# Patient Record
Sex: Female | Born: 1950 | Hispanic: No | Smoking: Never smoker
Health system: Southern US, Community
[De-identification: ages and names within clinical notes are randomized; demographics above are authoritative.]

## PROBLEM LIST (undated history)

## (undated) DIAGNOSIS — I10 Essential (primary) hypertension: Secondary | ICD-10-CM

---

## 2005-07-03 ENCOUNTER — Ambulatory Visit: Payer: Self-pay

## 2006-09-09 ENCOUNTER — Ambulatory Visit: Payer: Self-pay

## 2007-12-15 ENCOUNTER — Ambulatory Visit: Payer: Self-pay

## 2008-10-19 ENCOUNTER — Ambulatory Visit: Payer: Self-pay | Admitting: Gastroenterology

## 2008-12-28 ENCOUNTER — Ambulatory Visit: Payer: Self-pay | Admitting: Internal Medicine

## 2009-07-03 ENCOUNTER — Ambulatory Visit: Payer: Self-pay | Admitting: Oncology

## 2009-07-20 ENCOUNTER — Other Ambulatory Visit: Admission: RE | Admit: 2009-07-20 | Discharge: 2009-07-20 | Payer: Self-pay | Admitting: Oncology

## 2009-07-20 LAB — CBC WITH DIFFERENTIAL (CANCER CENTER ONLY)
BASO#: 0 10*3/uL (ref 0.0–0.2)
EOS%: 12.3 % — ABNORMAL HIGH (ref 0.0–7.0)
Eosinophils Absolute: 0.6 10*3/uL — ABNORMAL HIGH (ref 0.0–0.5)
HGB: 12.7 g/dL (ref 11.6–15.9)
LYMPH%: 53.6 % — ABNORMAL HIGH (ref 14.0–48.0)
MCH: 28.5 pg (ref 26.0–34.0)
MCHC: 33.3 g/dL (ref 32.0–36.0)
MCV: 86 fL (ref 81–101)
MONO%: 5 % (ref 0.0–13.0)
NEUT#: 1.4 10*3/uL — ABNORMAL LOW (ref 1.5–6.5)
RBC: 4.47 10*6/uL (ref 3.70–5.32)

## 2009-07-20 LAB — CMP (CANCER CENTER ONLY)
Albumin: 3.9 g/dL (ref 3.3–5.5)
Alkaline Phosphatase: 53 U/L (ref 26–84)
BUN, Bld: 17 mg/dL (ref 7–22)
CO2: 32 mEq/L (ref 18–33)
Calcium: 9.6 mg/dL (ref 8.0–10.3)
Chloride: 96 mEq/L — ABNORMAL LOW (ref 98–108)
Glucose, Bld: 87 mg/dL (ref 73–118)
Potassium: 3.8 mEq/L (ref 3.3–4.7)
Sodium: 133 mEq/L (ref 128–145)
Total Protein: 7.1 g/dL (ref 6.4–8.1)

## 2009-07-21 LAB — IRON AND TIBC
Iron: 69 ug/dL (ref 42–145)
UIBC: 213 ug/dL

## 2009-07-21 LAB — LACTATE DEHYDROGENASE: LDH: 180 U/L (ref 94–250)

## 2009-07-21 LAB — FERRITIN: Ferritin: 290 ng/mL (ref 10–291)

## 2009-08-09 ENCOUNTER — Ambulatory Visit: Payer: Self-pay | Admitting: Oncology

## 2009-09-06 ENCOUNTER — Ambulatory Visit: Payer: Self-pay | Admitting: Oncology

## 2009-09-12 LAB — CBC WITH DIFFERENTIAL (CANCER CENTER ONLY)
BASO#: 0 10*3/uL (ref 0.0–0.2)
EOS%: 9.7 % — ABNORMAL HIGH (ref 0.0–7.0)
Eosinophils Absolute: 0.5 10*3/uL (ref 0.0–0.5)
HCT: 38.9 % (ref 34.8–46.6)
HGB: 13 g/dL (ref 11.6–15.9)
LYMPH#: 2.3 10*3/uL (ref 0.9–3.3)
MCH: 29 pg (ref 26.0–34.0)
MCHC: 33.4 g/dL (ref 32.0–36.0)
MONO%: 5.6 % (ref 0.0–13.0)
NEUT#: 1.7 10*3/uL (ref 1.5–6.5)
NEUT%: 35.2 % — ABNORMAL LOW (ref 39.6–80.0)
RBC: 4.47 10*6/uL (ref 3.70–5.32)

## 2010-01-01 ENCOUNTER — Ambulatory Visit: Payer: Self-pay | Admitting: Unknown Physician Specialty

## 2011-01-08 ENCOUNTER — Ambulatory Visit: Payer: Self-pay | Admitting: Unknown Physician Specialty

## 2012-01-09 ENCOUNTER — Ambulatory Visit: Payer: Self-pay | Admitting: Internal Medicine

## 2013-08-18 ENCOUNTER — Ambulatory Visit: Payer: Self-pay

## 2014-10-17 IMAGING — MG MM CAD SCREENING MAMMO
1 series · 5 of 5 positions shown · non-contrast
Comparison: none

REASON FOR EXAM: SCR MAMMO NO ORDER
COMMENTS:

[R CC · right · 5 of 5 slices shown]
[im 1/5]
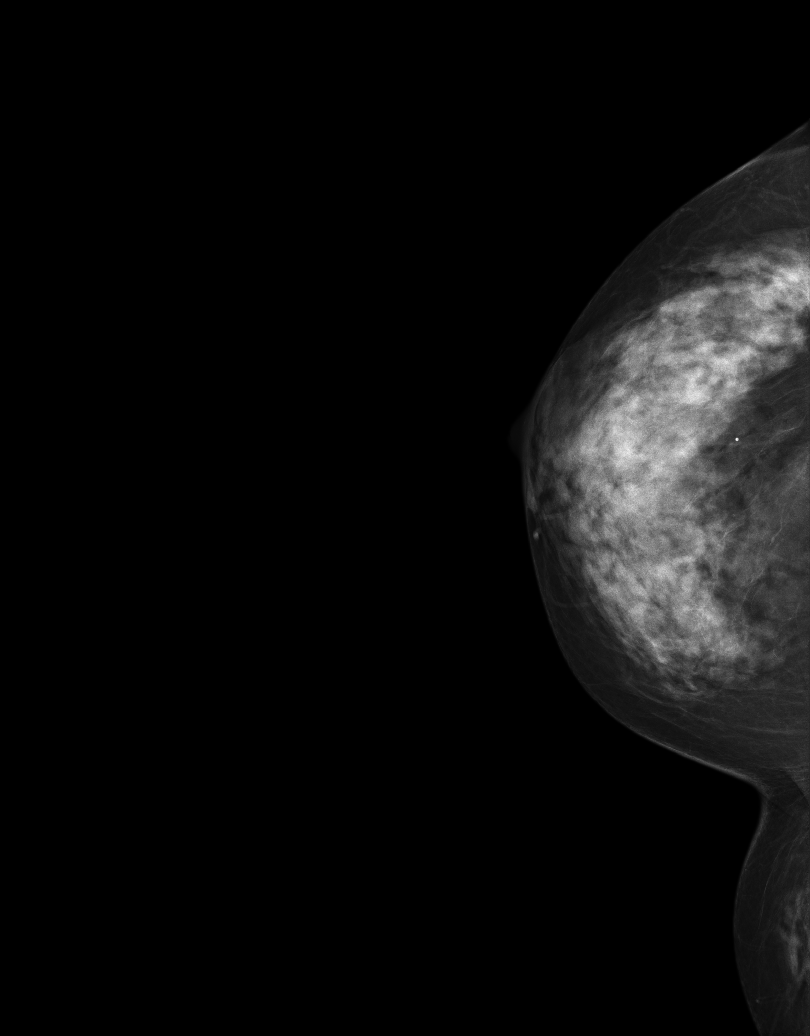
[im 2/5]
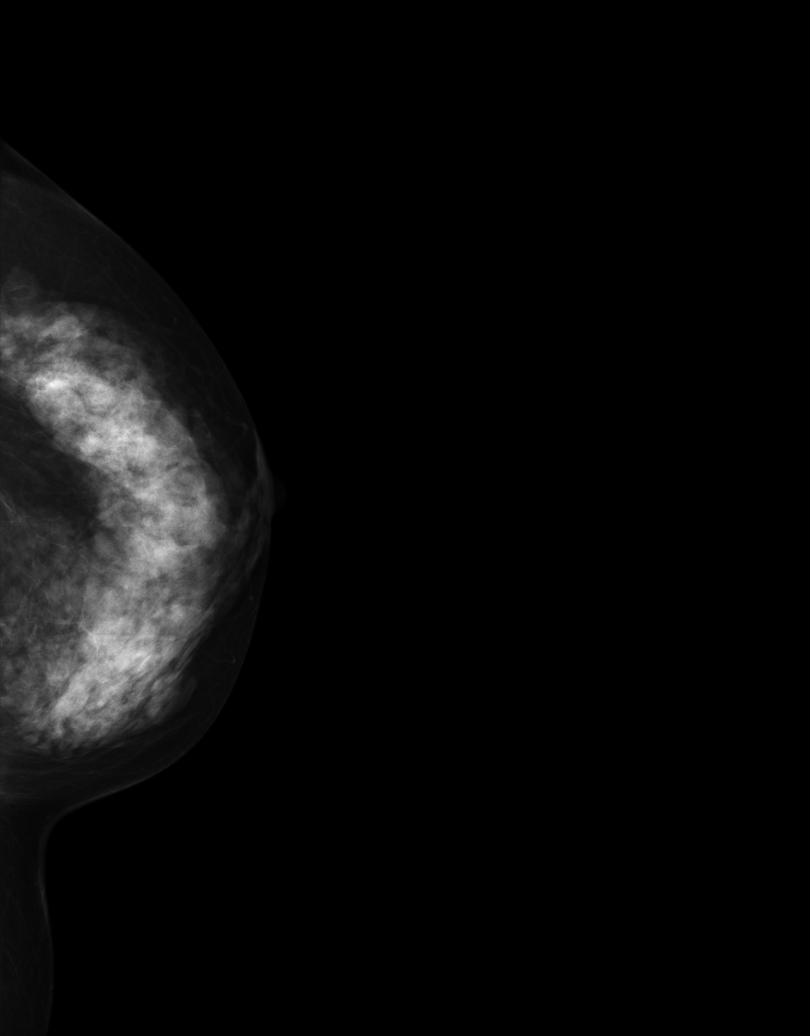
[im 3/5]
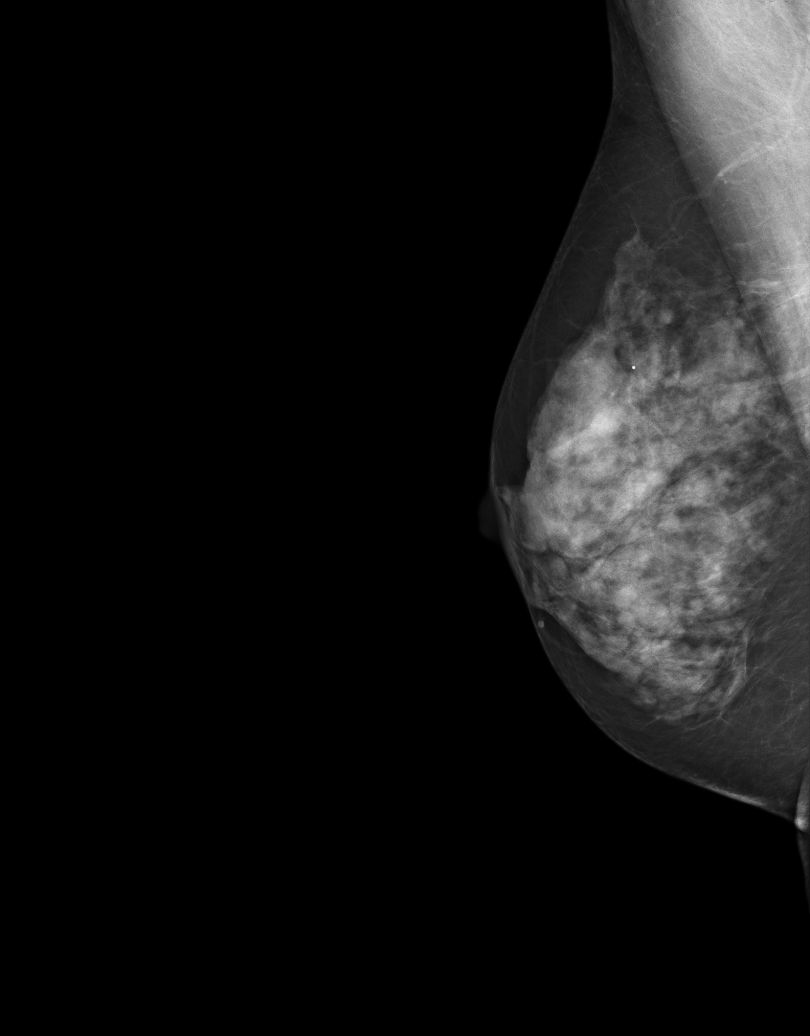
[im 4/5]
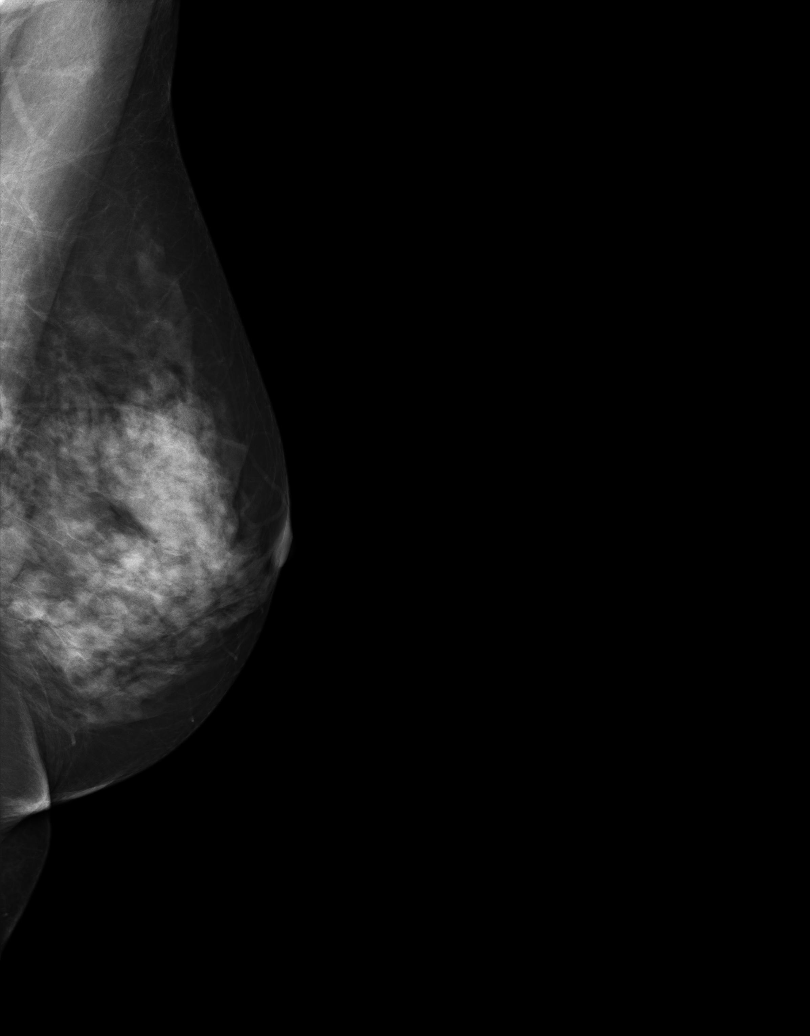
[im 5/5]
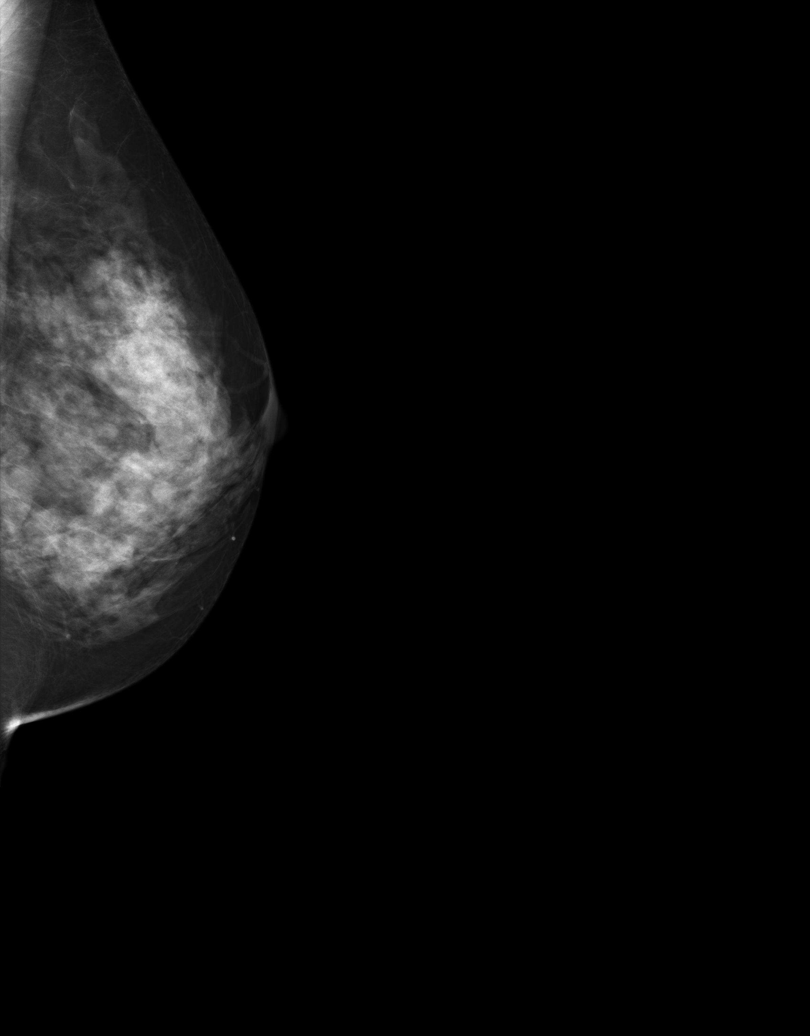

[5 of 5 positions shown; findings below may reference images not displayed]

PROCEDURE:     MAM - MAM DGTL SCRN MAM NO ORDER W/CAD  - January 09, 2012  [DATE]

RESULT:     There is no known family history or personal history of breast
cancer. The patient denies previous breast surgery. Comparison is made to
previous digital mammographic images from 08 January, 2011, as well as 01 January, 2010 and 15 December, 2007. The breasts exhibit a dense nodular,
fibroglandular pattern with stable, benign calcification. There is no
architectural distortion, developing parenchymal density or dominant mass.
The mammographic appearance is stable.
IMPRESSION: 1. Stable, benign appearing bilateral mammogram.

BI-RADS: Category 2 - Benign Finding.

Please continue to encourage annual mammographic follow-up.

A NEGATIVE MAMMOGRAM REPORT DOES NOT PRECLUDE BIOPSY OR OTHER EVALUATION OF
A CLINICALLY PALPABLE OR OTHERWISE SUSPICIOUS MASS OR LESION. BREAST CANCER
MAY NOT BE DETECTED BY MAMMOGRAPHY IN UP TO 10% OF CASES.

[REDACTED]

## 2017-06-14 ENCOUNTER — Other Ambulatory Visit: Payer: Self-pay

## 2017-06-14 ENCOUNTER — Ambulatory Visit
Admission: EM | Admit: 2017-06-14 | Discharge: 2017-06-14 | Disposition: A | Discharge status: Home / Self Care | Payer: Managed Care, Other (non HMO) | Attending: Family Medicine | Admitting: Family Medicine

## 2017-06-14 ENCOUNTER — Ambulatory Visit: Payer: Managed Care, Other (non HMO)

## 2017-06-14 DIAGNOSIS — J069 Acute upper respiratory infection, unspecified: Secondary | ICD-10-CM | POA: Diagnosis not present

## 2017-06-14 HISTORY — DX: Essential (primary) hypertension: I10

## 2017-06-14 MED ORDER — BENZONATATE 100 MG PO CAPS: 100.0000 mg | ORAL_CAPSULE | Freq: Three times a day (TID) | ORAL | 0 refills | Status: DC

## 2017-06-14 MED ORDER — AZITHROMYCIN 250 MG PO TABS: ORAL_TABLET | ORAL | 0 refills | Status: DC

## 2017-06-14 MED ORDER — PREDNISONE 10 MG PO TABS: 20.0000 mg | ORAL_TABLET | Freq: Every day | ORAL | 0 refills | Status: DC

## 2017-06-14 MED ORDER — HYDROCODONE-HOMATROPINE 5-1.5 MG/5ML PO SYRP: 5.0000 mL | ORAL_SOLUTION | Freq: Every evening | ORAL | 0 refills | Status: DC | PRN

## 2017-06-14 NOTE — Discharge Instructions (Signed)
-  Take medications as prescribed. -If cough worsens contact PCP or [facility]. -Mucinex OTC for chest congestion.

## 2017-06-14 NOTE — ED Triage Notes (Signed)
Pt with 3 days of nasal and head congestion, chest congestion, and cough. Now with some laryngitis.

## 2017-06-14 NOTE — ED Provider Notes (Signed)
[facility]    CSN: [id number] Arrival date & time: 06/14/17  9509     History   Chief Complaint Chief Complaint  Patient presents with  . Nasal Congestion    HPI Charlotte Jennings is a 67 y.o. female who presents today for evaluation of a 3-day history of headache, productive cough, chest and nasal congestion and mild fever.  The patient reports that cough is consistent throughout the day and worse at night, she does produce yellow mucus material.  She denies significant facial pressure, she does feel that she is experiencing postnasal drip.  She denies any flu exposure.  She denies any recent sick contacts.  She does not smoke.  No history of asthma or COPD.  Originally she did not have any sore throat however due to her recent cough she has increased throat irritation and has developed hoarseness.  Denies any shortness of breath.  HPI  Past Medical History:  Diagnosis Date  . Hypertension     There are no active problems to display for this patient.   History reviewed. No pertinent surgical history.  OB History   None      Home Medications    Prior to Admission medications   Medication Sig Start Date End Date Taking? Authorizing Provider  lisinopril-hydrochlorothiazide (PRINZIDE,ZESTORETIC) 20-25 MG tablet Take 1 tablet by mouth daily.   Yes [provider]  azithromycin (ZITHROMAX Z-PAK) 250 MG tablet Take 2 tabs on day 1 and 1 tablet each day after 06/14/17   Lattie Corns, PA-C  benzonatate (TESSALON) 100 MG capsule Take 1 capsule (100 mg total) by mouth every 8 (eight) hours. 06/14/17   Lattie Corns, PA-C  HYDROcodone-homatropine [facility]) 5-1.5 MG/5ML syrup Take 5 mLs by mouth at bedtime as needed for cough. 06/14/17   Lattie Corns, PA-C  predniSONE (DELTASONE) 10 MG tablet Take 2 tablets (20 mg total) by mouth daily. 06/14/17   Lattie Corns, PA-C    Family History History reviewed. No pertinent family  history.  Social History Social History   Tobacco Use  . Smoking status: Never Smoker  . Smokeless tobacco: Never Used  Substance Use Topics  . Alcohol use: Not Currently  . Drug use: Not Currently     Allergies   Patient has no known allergies.   Review of Systems Review of Systems  Constitutional: Negative.   HENT: Positive for sore throat. Negative for trouble swallowing.   Eyes: Negative.   Respiratory: Positive for cough.   Cardiovascular: Negative.   Gastrointestinal: Negative.   Endocrine: Negative.   Genitourinary: Negative.   Musculoskeletal: Negative.   Skin: Negative.   Allergic/Immunologic: Negative.   Neurological: Negative.   Hematological: Negative.   Psychiatric/Behavioral: Negative.    Physical Exam Triage Vital Signs ED Triage Vitals  Enc Vitals Group     BP 06/14/17 1024 (!) 145/98     Pulse Rate 06/14/17 1024 71     Resp 06/14/17 1024 16     Temp 06/14/17 1024 98.2 F (36.8 C)     Temp Source 06/14/17 1024 Oral     SpO2 06/14/17 1024 99 %     Weight 06/14/17 1025 150 lb (68 kg)     Height 06/14/17 1025 5\' 5"  (1.651 m)     Head Circumference --      Peak Flow --      Pain Score 06/14/17 1025 0     Pain Loc --      Pain  Edu? --      Excl. in [place]? --    No data found.  Updated Vital Signs BP (!) 145/98 (BP Location: Left Arm)   Pulse 71   Temp 98.2 F (36.8 C) (Oral)   Resp 16   Ht 5\' 5"  (1.651 m)   Wt 150 lb (68 kg)   SpO2 99%   BMI 24.96 kg/m   Visual Acuity Right Eye Distance:   Left Eye Distance:   Bilateral Distance:    Right Eye Near:   Left Eye Near:    Bilateral Near:     Physical Exam  Constitutional: She appears well-developed and well-nourished. No distress.  HENT:  Bilateral tympanic membrane without evidence for infection or rupture.  Mild nasal erythema.  Mild oropharynx erythema.  No purulent drainage.  Nontender to palpation over the maxillary or frontal sinuses.  Cardiovascular: Normal rate, regular  rhythm and normal heart sounds.  Pulmonary/Chest: Effort normal.  Mild wheeze to bilateral upper lobes.  Abdominal: Soft.  Lymphadenopathy:    She has no cervical adenopathy.  Skin: She is not diaphoretic.   UC Treatments / Results  Labs (all labs ordered are listed, but only abnormal results are displayed) Labs Reviewed - No data to display  EKG None Radiology Dg Chest 2 View  Result Date: 06/14/2017 CLINICAL DATA:  67 year old with fever and congestion.  Cough. EXAM: CHEST - 2 VIEW COMPARISON:  None. FINDINGS: The heart size and mediastinal contours are within normal limits. Both lungs are clear. The visualized skeletal structures are unremarkable. IMPRESSION: No active cardiopulmonary disease. Electronically Signed   By: Markus Daft M.D.   On: 06/14/2017 12:28    Procedures Procedures (including critical care time)  Medications Ordered in UC Medications - No data to display   Initial Impression / Assessment and Plan / UC Course  I have reviewed the triage vital signs and the nursing notes.  Pertinent labs & imaging results that were available during my care of the patient were reviewed by me and considered in my medical decision making (see chart for details).     1.  Treatment options were discussed today. 2.  No signs of pneumonia on chest x-ray.  3.  The patient was prescribed a Z-Pak, prednisone taper, hycodan cough syrup and tessalon pearls. 4.  If symptoms continue or worsen she will follow-up with PCP or [facility].  Final Clinical Impressions(s) / UC Diagnoses   Final diagnoses:  Upper respiratory tract infection, unspecified type    ED Discharge Orders        Ordered    azithromycin (ZITHROMAX Z-PAK) 250 MG tablet     06/14/17 1242    predniSONE (DELTASONE) 10 MG tablet  Daily     06/14/17 1242    benzonatate (TESSALON) 100 MG capsule  Every 8 hours     06/14/17 1242    HYDROcodone-homatropine (HYCODAN) 5-1.5 MG/5ML syrup  At bedtime PRN      06/14/17 1242     Controlled Substance Prescriptions [place] Controlled Substance Registry consulted? Not Applicable   Lattie Corns, PA-C 06/14/17 1540

## 2018-01-26 ENCOUNTER — Encounter: Payer: Self-pay | Admitting: Family Medicine

## 2018-01-29 ENCOUNTER — Other Ambulatory Visit: Payer: Self-pay

## 2018-01-29 DIAGNOSIS — Z1211 Encounter for screening for malignant neoplasm of colon: Secondary | ICD-10-CM

## 2018-02-12 ENCOUNTER — Ambulatory Visit: Payer: Managed Care, Other (non HMO) | Admitting: Anesthesiology

## 2018-02-12 ENCOUNTER — Encounter
Admission: RE | Disposition: A | Discharge status: Home / Self Care | Payer: Self-pay | Source: Ambulatory Visit | Attending: Gastroenterology

## 2018-02-12 ENCOUNTER — Ambulatory Visit
Admission: RE | Admit: 2018-02-12 | Discharge: 2018-02-12 | Disposition: A | Discharge status: Home / Self Care | Payer: Managed Care, Other (non HMO) | Source: Ambulatory Visit | Attending: Gastroenterology | Admitting: Gastroenterology

## 2018-02-12 ENCOUNTER — Encounter: Payer: Self-pay | Admitting: *Deleted

## 2018-02-12 DIAGNOSIS — I10 Essential (primary) hypertension: Secondary | ICD-10-CM | POA: Insufficient documentation

## 2018-02-12 DIAGNOSIS — Z1211 Encounter for screening for malignant neoplasm of colon: Secondary | ICD-10-CM | POA: Insufficient documentation

## 2018-02-12 DIAGNOSIS — D123 Benign neoplasm of transverse colon: Secondary | ICD-10-CM | POA: Insufficient documentation

## 2018-02-12 DIAGNOSIS — Z79899 Other long term (current) drug therapy: Secondary | ICD-10-CM | POA: Diagnosis not present

## 2018-02-12 DIAGNOSIS — K5730 Diverticulosis of large intestine without perforation or abscess without bleeding: Secondary | ICD-10-CM | POA: Diagnosis not present

## 2018-02-12 HISTORY — PX: COLONOSCOPY WITH PROPOFOL: SHX5780

## 2018-02-12 SURGERY — COLONOSCOPY WITH PROPOFOL
Anesthesia: General

## 2018-02-12 MED ORDER — PROPOFOL 500 MG/50ML IV EMUL
INTRAVENOUS | Status: DC | PRN
  Administered 2018-02-12: 100 ug/kg/min via INTRAVENOUS

## 2018-02-12 MED ORDER — PROPOFOL 10 MG/ML IV BOLUS
INTRAVENOUS | Status: DC | PRN
  Administered 2018-02-12: 60 mg via INTRAVENOUS

## 2018-02-12 MED ORDER — SODIUM CHLORIDE 0.9 % IV SOLN
INTRAVENOUS | Status: DC
  Administered 2018-02-12: 1000 mL via INTRAVENOUS

## 2018-02-12 MED ORDER — PROPOFOL 10 MG/ML IV BOLUS
INTRAVENOUS | Status: AC
Start: 2018-02-12 — End: ?
  Filled 2018-02-12: qty 40

## 2018-02-12 MED ORDER — LIDOCAINE HCL (CARDIAC) PF 100 MG/5ML IV SOSY
PREFILLED_SYRINGE | INTRAVENOUS | Status: DC | PRN
  Administered 2018-02-12: 80 mg via INTRAVENOUS

## 2018-02-12 NOTE — Transfer of Care (Signed)
Immediate Anesthesia Transfer of Care Note  Patient: Charlotte Jennings  Procedure(s) Performed: COLONOSCOPY WITH PROPOFOL (N/A )  Patient Location: PACU  Anesthesia Type:General  Level of Consciousness: sedated  Airway & Oxygen Therapy: Patient Spontanous Breathing and Patient connected to nasal cannula oxygen  Post-op Assessment: Report given to RN and Post -op Vital signs reviewed and stable  Post vital signs: Reviewed and stable  Last Vitals:  Vitals Value Taken Time  BP    Temp    Pulse 67 02/12/2018  9:39 AM  Resp 17 02/12/2018  9:39 AM  SpO2 99 % 02/12/2018  9:39 AM  Vitals shown include unvalidated device data.  Last Pain:  Vitals:   02/12/18 0835  TempSrc: Tympanic  PainSc: 0-No pain         Complications: No apparent anesthesia complications

## 2018-02-12 NOTE — H&P (Signed)
Jonathon Bellows, MD [address], [place], [place], [place], [address] [address], [place], [place], [place], [address] Phone: [phone]  Fax: [phone]  Primary Care Physician:  Patient, No Pcp Per   Pre-Procedure History & Physical: HPI:  Charlotte Jennings is a 67 y.o. female is here for an colonoscopy.   Past Medical History:  Diagnosis Date  . Hypertension     History reviewed. No pertinent surgical history.  Prior to Admission medications   Medication Sig Start Date End Date Taking? Authorizing Provider  HYDROcodone-homatropine (HYCODAN) 5-1.5 MG/5ML syrup Take 5 mLs by mouth at bedtime as needed for cough. 06/14/17  Yes Lattie Corns, PA-C  lisinopril-hydrochlorothiazide (PRINZIDE,ZESTORETIC) 20-25 MG tablet Take 1 tablet by mouth daily.   Yes [provider]  azithromycin (ZITHROMAX Z-PAK) 250 MG tablet Take 2 tabs on day 1 and 1 tablet each day after Patient not taking: Reported on 02/12/2018 06/14/17   Lattie Corns, PA-C  benzonatate (TESSALON) 100 MG capsule Take 1 capsule (100 mg total) by mouth every 8 (eight) hours. Patient not taking: Reported on 02/12/2018 06/14/17   Lattie Corns, PA-C  predniSONE (DELTASONE) 10 MG tablet Take 2 tablets (20 mg total) by mouth daily. Patient not taking: Reported on 02/12/2018 06/14/17   Lattie Corns, PA-C    Allergies as of 01/29/2018  . (No Known Allergies)    History reviewed. No pertinent family history.  Social History   Socioeconomic History  . Marital status: Divorced    Spouse name: Not on file  . Number of children: Not on file  . Years of education: Not on file  . Highest education level: Not on file  Occupational History  . Not on file  Social Needs  . Financial resource strain: Not on file  . Food insecurity:    Worry: Not on file    Inability: Not on file  . Transportation needs:    Medical: Not on file    Non-medical: Not on file  Tobacco Use  . Smoking  status: Never Smoker  . Smokeless tobacco: Never Used  Substance and Sexual Activity  . Alcohol use: Not Currently  . Drug use: Never  . Sexual activity: Not on file  Lifestyle  . Physical activity:    Days per week: Not on file    Minutes per session: Not on file  . Stress: Not on file  Relationships  . Social connections:    Talks on phone: Not on file    Gets together: Not on file    Attends religious service: Not on file    Active member of club or organization: Not on file    Attends meetings of clubs or organizations: Not on file    Relationship status: Not on file  . Intimate partner violence:    Fear of current or ex partner: Not on file    Emotionally abused: Not on file    Physically abused: Not on file    Forced sexual activity: Not on file  Other Topics Concern  . Not on file  Social History Narrative  . Not on file    Review of Systems: See HPI, otherwise negative ROS  Physical Exam: BP (!) 179/91   Pulse 67   Temp (!) 96.5 F (35.8 C) (Tympanic)   Resp 18   Ht 5\' 6"  (1.676 m)   Wt 65.8 kg   SpO2 100%   BMI 23.40 kg/m  General:  Alert,  pleasant and cooperative in NAD Head:  Normocephalic and atraumatic. Neck:  Supple; no masses or thyromegaly. Lungs:  Clear throughout to auscultation, normal respiratory effort.    Heart:  +S1, +S2, Regular rate and rhythm, No edema. Abdomen:  Soft, nontender and nondistended. Normal bowel sounds, without guarding, and without rebound.   Neurologic:  Alert and  oriented x4;  grossly normal neurologically.  Impression/Plan: Charlotte Jennings is here for an colonoscopy to be performed for Screening colonoscopy average risk   Risks, benefits, limitations, and alternatives regarding  colonoscopy have been reviewed with the patient.  Questions have been answered.  All parties agreeable.   Jonathon Bellows, MD  02/12/2018, 9:06 AM

## 2018-02-12 NOTE — Anesthesia Preprocedure Evaluation (Signed)
Anesthesia Evaluation  Patient identified by MRN, date of birth, ID band Patient awake    Reviewed: Allergy & Precautions, H&P , NPO status , Patient's Chart, lab work & pertinent test results  History of Anesthesia Complications Negative for: history of anesthetic complications  Airway Mallampati: II  TM Distance: >3 FB Neck ROM: full    Dental  (+) Chipped, Poor Dentition, Missing, Partial Upper   Pulmonary neg pulmonary ROS, neg shortness of breath,           Cardiovascular Exercise Tolerance: Good hypertension, (-) angina(-) Past MI and (-) DOE      Neuro/Psych negative neurological ROS  negative psych ROS   GI/Hepatic negative GI ROS, Neg liver ROS, neg GERD  ,  Endo/Other  negative endocrine ROS  Renal/GU negative Renal ROS  negative genitourinary   Musculoskeletal   Abdominal   Peds  Hematology negative hematology ROS (+)   Anesthesia Other Findings Past Medical History: No date: Hypertension  History reviewed. No pertinent surgical history.  BMI    Body Mass Index:  23.40 kg/m      Reproductive/Obstetrics negative OB ROS                             Anesthesia Physical Anesthesia Plan  ASA: III  Anesthesia Plan: General   Post-op Pain Management:    Induction: Intravenous  PONV Risk Score and Plan: Propofol infusion and TIVA  Airway Management Planned: Natural Airway and Nasal Cannula  Additional Equipment:   Intra-op Plan:   Post-operative Plan:   Informed Consent: I have reviewed the patients History and Physical, chart, labs and discussed the procedure including the risks, benefits and alternatives for the proposed anesthesia with the patient or authorized representative who has indicated his/her understanding and acceptance.   Dental Advisory Given  Plan Discussed with: Anesthesiologist, CRNA and Surgeon  Anesthesia Plan Comments: (Patient consented  for risks of anesthesia including but not limited to:  - adverse reactions to medications - risk of intubation if required - damage to teeth, lips or other oral mucosa - sore throat or hoarseness - Damage to heart, brain, lungs or loss of life  Patient voiced understanding.)        Anesthesia Quick Evaluation

## 2018-02-12 NOTE — Anesthesia Post-op Follow-up Note (Signed)
Anesthesia QCDR form completed.        

## 2018-02-12 NOTE — Anesthesia Postprocedure Evaluation (Signed)
Anesthesia Post Note  Patient: Charlotte Jennings  Procedure(s) Performed: COLONOSCOPY WITH PROPOFOL (N/A )  Patient location during evaluation: Endoscopy Anesthesia Type: General Level of consciousness: awake and alert Pain management: pain level controlled Vital Signs Assessment: post-procedure vital signs reviewed and stable Respiratory status: spontaneous breathing, nonlabored ventilation, respiratory function stable and patient connected to nasal cannula oxygen Cardiovascular status: blood pressure returned to baseline and stable Postop Assessment: no apparent nausea or vomiting Anesthetic complications: no     Last Vitals:  Vitals:   02/12/18 0940 02/12/18 1010  BP: 130/87 (!) 158/95  Pulse: 66   Resp: 17   Temp: (!) 35.9 C   SpO2: 99%     Last Pain:  Vitals:   02/12/18 1010  TempSrc:   PainSc: 0-No pain                 Precious Haws Juel Bellerose

## 2018-02-12 NOTE — Op Note (Addendum)
[facility] Gastroenterology Patient Name: Charlotte Jennings Procedure Date: 02/12/2018 9:08 AM MRN: [id number] Account #: [id number] Date of Birth: 1951/02/23 Admit Type: Outpatient Age: 67 Room: [facility] ENDO ROOM 4 Gender: Female Note Status: Finalized Procedure:            Colonoscopy Indications:          Screening for colorectal malignant neoplasm Providers:            Jonathon Bellows MD, MD Referring MD:         No Local Md, MD (Referring MD) Medicines:            Monitored Anesthesia Care Complications:        No immediate complications. Procedure:            Pre-Anesthesia Assessment:                       - Prior to the procedure, a History and Physical was                        performed, and patient medications, allergies and                        sensitivities were reviewed. The patient's tolerance of                        previous anesthesia was reviewed.                       - The risks and benefits of the procedure and the                        sedation options and risks were discussed with the                        patient. All questions were answered and informed                        consent was obtained.                       - ASA Grade Assessment: II - A patient with mild                        systemic disease.                       After obtaining informed consent, the colonoscope was                        passed under direct vision. Throughout the procedure,                        the patient's blood pressure, pulse, and oxygen                        saturations were monitored continuously. The                        Colonoscope was introduced through the anus and  advanced to the the cecum, identified by the                        appendiceal orifice, IC valve and transillumination.                        The colonoscopy was performed with ease. The patient                        tolerated the procedure well. The  quality of the bowel                        preparation was good. Findings:      The perianal and digital rectal examinations were normal.      Multiple small-mouthed diverticula were found in the entire colon.      A 3 mm polyp was found in the transverse colon. The polyp was sessile.       The polyp was removed with a cold biopsy forceps. Resection and       retrieval were complete.      The exam was otherwise without abnormality on direct and retroflexion       views. Impression:           - Diverticulosis in the entire examined colon.                       - One 3 mm polyp in the transverse colon, removed with                        a cold biopsy forceps. Resected and retrieved.                       - The examination was otherwise normal on direct and                        retroflexion views. Recommendation:       - Discharge patient to home (with escort).                       - Resume previous diet.                       - Continue present medications.                       - Await pathology results.                       - Repeat colonoscopy in 5-10 years for surveillance                        based on pathology results. Procedure Code(s):    --- Professional ---                       [phone], Colonoscopy, flexible; with biopsy, single or                        multiple Diagnosis Code(s):    --- Professional ---                       Z12.11,  Encounter for screening for malignant neoplasm                        of colon                       D12.3, Benign neoplasm of transverse colon (hepatic                        flexure or splenic flexure)                       K57.30, Diverticulosis of large intestine without                        perforation or abscess without bleeding CPT copyright 2018 American Medical Association. All rights reserved. The codes documented in this report are preliminary and upon coder review may  be revised to meet current compliance  requirements. Jonathon Bellows, MD Jonathon Bellows MD, MD 02/12/2018 9:41:26 AM This report has been signed electronically. Number of Addenda: 0 Note Initiated On: 02/12/2018 9:08 AM Scope Withdrawal Time: 0 hours 15 minutes 2 seconds  Total Procedure Duration: 0 hours 19 minutes 59 seconds       [facility]

## 2018-02-13 ENCOUNTER — Encounter: Payer: Self-pay | Admitting: Gastroenterology

## 2018-02-13 LAB — SURGICAL PATHOLOGY

## 2018-02-16 ENCOUNTER — Encounter: Payer: Self-pay | Admitting: Gastroenterology

## 2018-10-26 ENCOUNTER — Other Ambulatory Visit: Payer: Self-pay | Admitting: Family Medicine

## 2018-10-26 DIAGNOSIS — Z1231 Encounter for screening mammogram for malignant neoplasm of breast: Secondary | ICD-10-CM

## 2018-12-11 ENCOUNTER — Ambulatory Visit
Admission: RE | Admit: 2018-12-11 | Discharge: 2018-12-11 | Disposition: A | Discharge status: Home / Self Care | Payer: BC Managed Care – PPO | Source: Ambulatory Visit | Attending: Family Medicine | Admitting: Family Medicine

## 2018-12-11 DIAGNOSIS — Z1231 Encounter for screening mammogram for malignant neoplasm of breast: Secondary | ICD-10-CM | POA: Diagnosis present

## 2019-03-30 ENCOUNTER — Other Ambulatory Visit: Payer: Self-pay | Admitting: Family Medicine

## 2019-03-30 DIAGNOSIS — M810 Age-related osteoporosis without current pathological fracture: Secondary | ICD-10-CM

## 2020-03-22 IMAGING — CR DG CHEST 2V
2 series · 2 of 2 positions shown · non-contrast
Comparison: None.

CLINICAL DATA: 66-year-old with fever and congestion.  Cough.

EXAM:
CHEST - 2 VIEW

[chest pa]
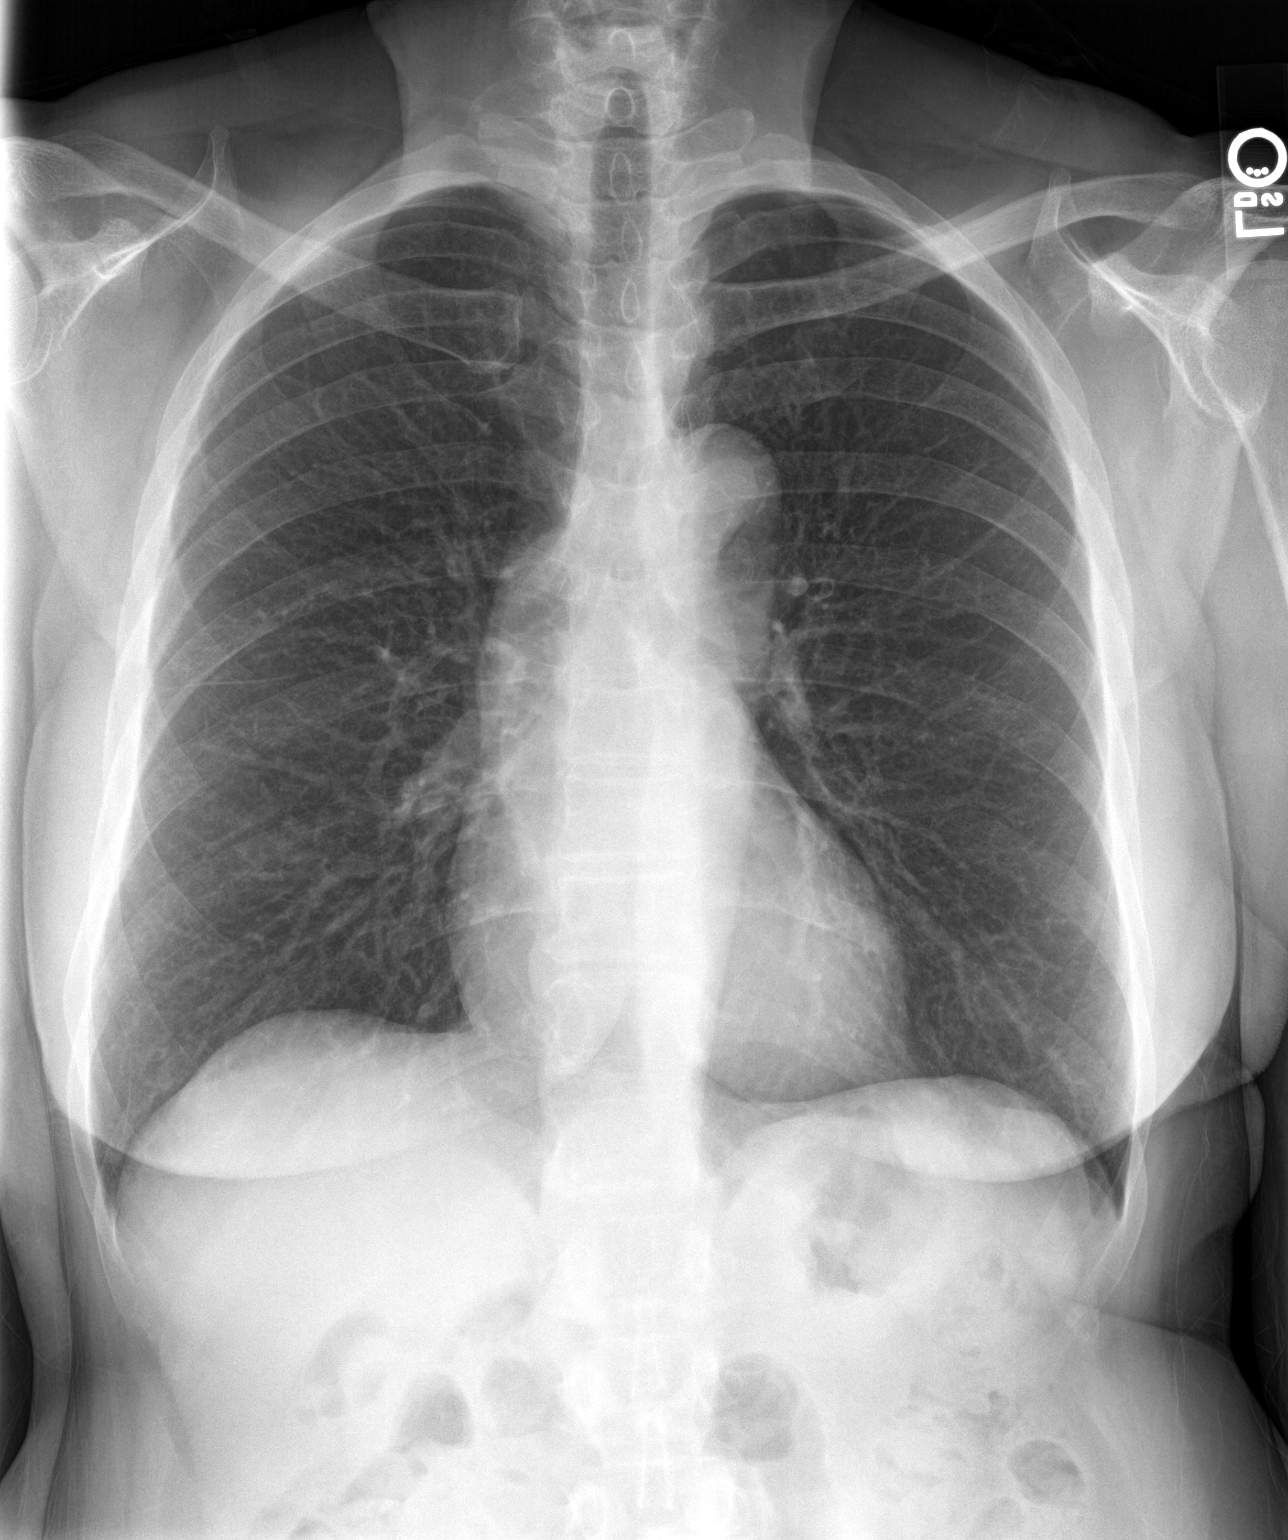

[chest lat]
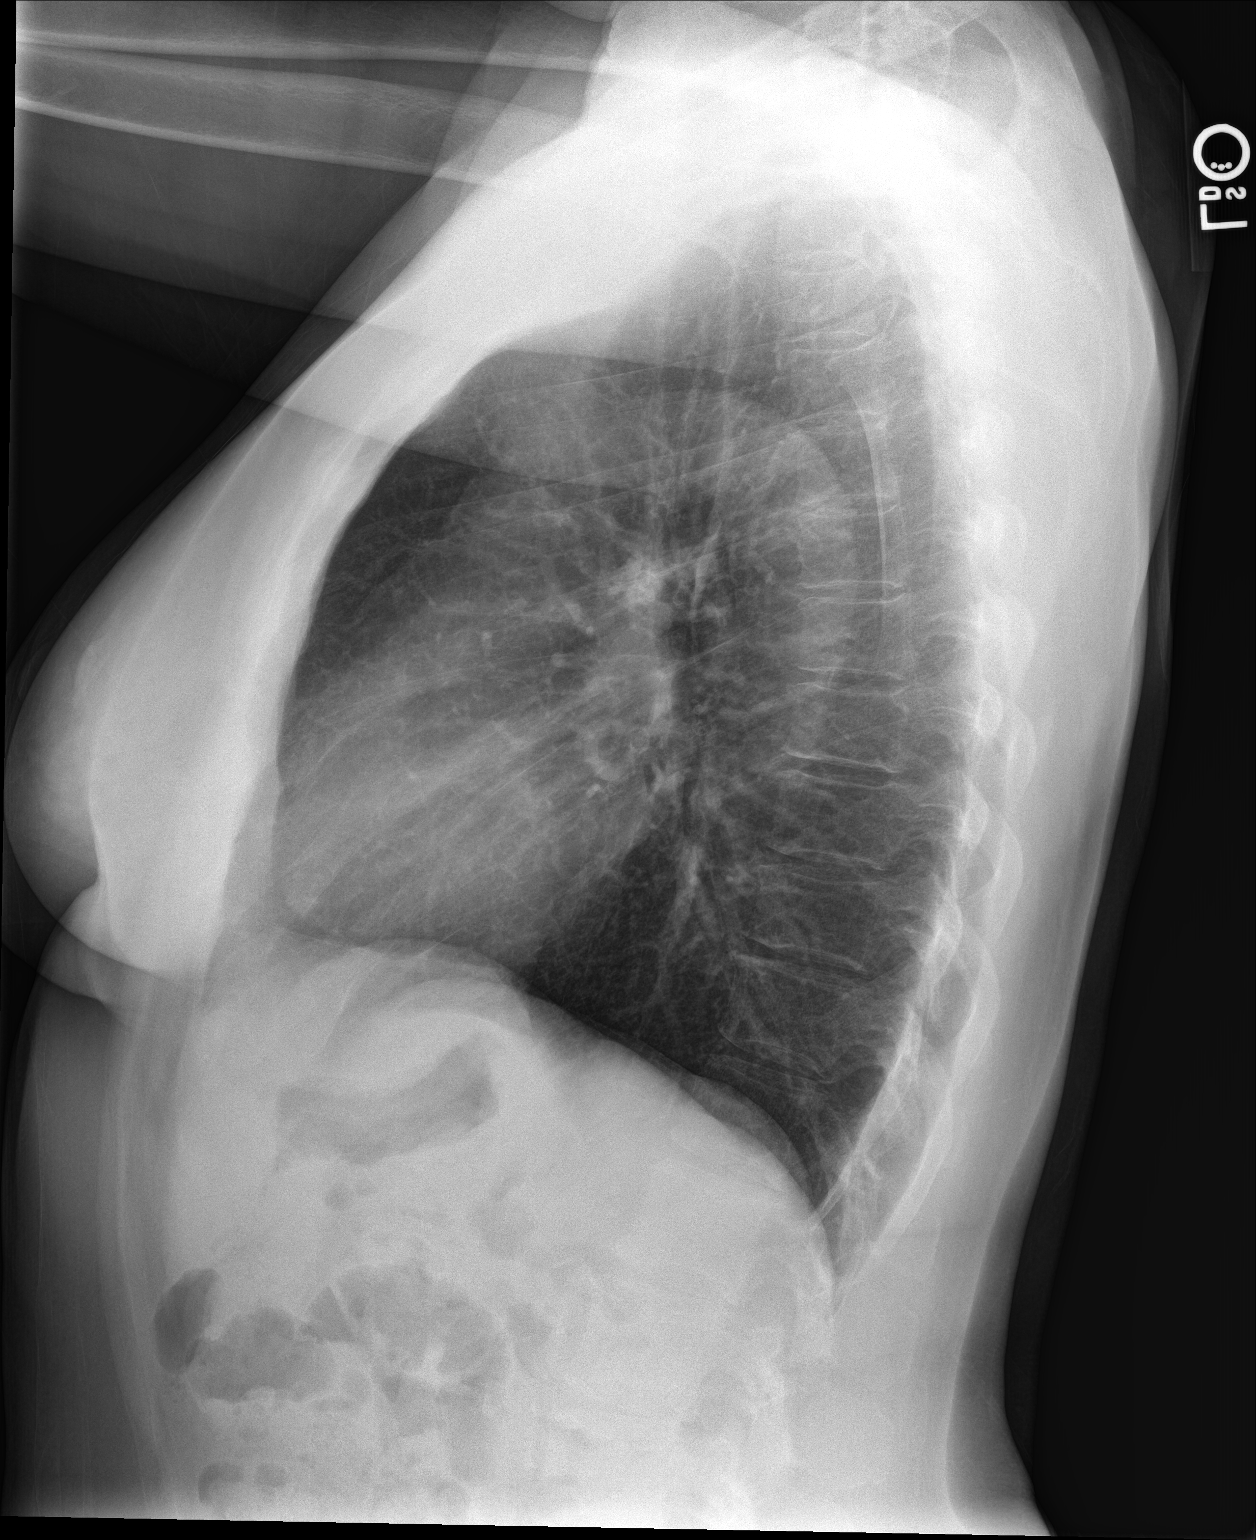

[2 of 2 positions shown; findings below may reference images not displayed]

FINDINGS: The heart size and mediastinal contours are within normal limits.
Both lungs are clear. The visualized skeletal structures are
unremarkable.
IMPRESSION: No active cardiopulmonary disease.

## 2020-09-05 ENCOUNTER — Other Ambulatory Visit: Payer: Self-pay | Admitting: Family Medicine

## 2020-09-05 DIAGNOSIS — Z1231 Encounter for screening mammogram for malignant neoplasm of breast: Secondary | ICD-10-CM

## 2022-11-06 ENCOUNTER — Telehealth: Payer: Self-pay

## 2022-11-06 NOTE — Telephone Encounter (Signed)
Pt left a vm wanting to know why she received a letter to schedule "something" in November. Tried calling pt back to inform her the letter was a colonoscopy recall to be set up for November per Dr. Tobi Bastos. Mailbox was full and I was unable to leave a message.

## 2023-01-28 ENCOUNTER — Telehealth: Payer: Self-pay

## 2023-01-28 ENCOUNTER — Other Ambulatory Visit: Payer: Self-pay

## 2023-01-28 DIAGNOSIS — Z860100 Personal history of colon polyps, unspecified: Secondary | ICD-10-CM

## 2023-01-28 MED ORDER — NA SULFATE-K SULFATE-MG SULF 17.5-3.13-1.6 GM/177ML PO SOLN: 1.0000 | Freq: Once | ORAL | 0 refills | Status: AC

## 2023-01-28 NOTE — Telephone Encounter (Signed)
Pt requesting call back to schedule colonoscopy.

## 2023-01-28 NOTE — Telephone Encounter (Signed)
Gastroenterology Pre-Procedure Review  Request Date: 03/07/23 Requesting Physician: Dr. Tobi Bastos  PATIENT REVIEW QUESTIONS: The patient responded to the following health history questions as indicated:    1. Are you having any GI issues? no 2. Do you have a personal history of Polyps? Yes last colonoscopy performed by Dr. Tobi Bastos 02/12/2018 3. Do you have a family history of Colon Cancer or Polyps? no 4. Diabetes Mellitus? no 5. Joint replacements in the past 12 months?no 6. Major health problems in the past 3 months?no 7. Any artificial heart valves, MVP, or defibrillator?no    MEDICATIONS & ALLERGIES:    Patient reports the following regarding taking any anticoagulation/antiplatelet therapy:   Plavix, Coumadin, Eliquis, Xarelto, Lovenox, Pradaxa, Brilinta, or Effient? no Aspirin? no  Patient confirms/reports the following medications:  Current Outpatient Medications  Medication Sig Dispense Refill   Cholecalciferol (VITAMIN D-3 PO) Take by mouth.     lisinopril-hydrochlorothiazide (PRINZIDE,ZESTORETIC) 20-25 MG tablet Take 1 tablet by mouth daily.     azithromycin (ZITHROMAX Z-PAK) 250 MG tablet Take 2 tabs on day 1 and 1 tablet each day after (Patient not taking: Reported on 02/12/2018) 6 tablet 0   benzonatate (TESSALON) 100 MG capsule Take 1 capsule (100 mg total) by mouth every 8 (eight) hours. (Patient not taking: Reported on 02/12/2018) 21 capsule 0   HYDROcodone-homatropine (HYCODAN) 5-1.5 MG/5ML syrup Take 5 mLs by mouth at bedtime as needed for cough. (Patient not taking: Reported on 01/28/2023) 120 mL 0   predniSONE (DELTASONE) 10 MG tablet Take 2 tablets (20 mg total) by mouth daily. (Patient not taking: Reported on 02/12/2018) 10 tablet 0   No current facility-administered medications for this visit.    Patient confirms/reports the following allergies:  No Known Allergies  No orders of the defined types were placed in this encounter.   AUTHORIZATION INFORMATION Primary  Insurance: 1D#: Group #:  Secondary Insurance: 1D#: Group #:  SCHEDULE INFORMATION: Date: 03/07/23 Time: Location: [facility]

## 2023-02-28 ENCOUNTER — Encounter: Payer: Self-pay | Admitting: Gastroenterology

## 2023-03-07 ENCOUNTER — Ambulatory Visit: Payer: BC Managed Care – PPO | Admitting: Certified Registered Nurse Anesthetist

## 2023-03-07 ENCOUNTER — Ambulatory Visit
Admission: RE | Admit: 2023-03-07 | Discharge: 2023-03-07 | Disposition: A | Discharge status: Home / Self Care | Payer: BC Managed Care – PPO | Attending: Gastroenterology | Admitting: Gastroenterology

## 2023-03-07 ENCOUNTER — Encounter
Admission: RE | Disposition: A | Discharge status: Home / Self Care | Payer: Self-pay | Source: Home / Self Care | Attending: Gastroenterology

## 2023-03-07 DIAGNOSIS — Z860101 Personal history of adenomatous and serrated colon polyps: Secondary | ICD-10-CM | POA: Diagnosis not present

## 2023-03-07 DIAGNOSIS — Z1211 Encounter for screening for malignant neoplasm of colon: Secondary | ICD-10-CM | POA: Diagnosis present

## 2023-03-07 DIAGNOSIS — Z539 Procedure and treatment not carried out, unspecified reason: Secondary | ICD-10-CM | POA: Insufficient documentation

## 2023-03-07 HISTORY — PX: COLONOSCOPY WITH PROPOFOL: SHX5780

## 2023-03-07 SURGERY — COLONOSCOPY WITH PROPOFOL
Anesthesia: General

## 2023-03-07 NOTE — Progress Notes (Signed)
Patient vomited most of the prep, she is not clean will need to reschedule possible with a different prep

## 2023-03-10 ENCOUNTER — Encounter: Payer: Self-pay | Admitting: Gastroenterology

## 2023-09-25 ENCOUNTER — Other Ambulatory Visit: Payer: Self-pay

## 2023-09-25 ENCOUNTER — Telehealth: Payer: Self-pay

## 2023-09-25 DIAGNOSIS — Z860100 Personal history of colon polyps, unspecified: Secondary | ICD-10-CM

## 2023-09-25 MED ORDER — NA SULFATE-K SULFATE-MG SULF 17.5-3.13-1.6 GM/177ML PO SOLN: 1.0000 | Freq: Once | ORAL | 0 refills | Status: AC

## 2023-09-25 NOTE — Telephone Encounter (Signed)
 Gastroenterology Pre-Procedure Review   Request Date: 12/18/23 Requesting Physician: Dr. Therisa   PATIENT REVIEW QUESTIONS: The patient responded to the following health history questions as indicated:     1. Are you having any GI issues? no 2. Do you have a personal history of Polyps? Yes last colonoscopy performed by Dr. Therisa 02/12/2018 3. Do you have a family history of Colon Cancer or Polyps? no 4. Diabetes Mellitus? no 5. Joint replacements in the past 12 months?no 6. Major health problems in the past 3 months?no 7. Any artificial heart valves, MVP, or defibrillator?no    MEDICATIONS & ALLERGIES:    Patient reports the following regarding taking any anticoagulation/antiplatelet therapy:   Plavix, Coumadin, Eliquis, Xarelto, Lovenox, Pradaxa, Brilinta, or Effient? no Aspirin? no   Patient confirms/reports the following medications:        Current Outpatient Medications  Medication Sig Dispense Refill   Cholecalciferol (VITAMIN D-3 PO) Take by mouth.       lisinopril-hydrochlorothiazide (PRINZIDE,ZESTORETIC) 20-25 MG tablet Take 1 tablet by mouth daily.       azithromycin  (ZITHROMAX  Z-PAK) 250 MG tablet Take 2 tabs on day 1 and 1 tablet each day after (Patient not taking: Reported on 02/12/2018) 6 tablet 0   benzonatate  (TESSALON ) 100 MG capsule Take 1 capsule (100 mg total) by mouth every 8 (eight) hours. (Patient not taking: Reported on 02/12/2018) 21 capsule 0   HYDROcodone -homatropine (HYCODAN) 5-1.5 MG/5ML syrup Take 5 mLs by mouth at bedtime as needed for cough. (Patient not taking: Reported on 01/28/2023) 120 mL 0   predniSONE  (DELTASONE ) 10 MG tablet Take 2 tablets (20 mg total) by mouth daily. (Patient not taking: Reported on 02/12/2018) 10 tablet 0      No current facility-administered medications for this visit.        Patient confirms/reports the following allergies:  Allergies  No Known Allergies     No orders of the defined types were placed in this encounter.      AUTHORIZATION INFORMATION Primary Insurance: 1D#: Group #:   Secondary Insurance: 1D#: Group #:   SCHEDULE INFORMATION: Date: 12/18/23 Time: Location: [facility]

## 2023-12-18 ENCOUNTER — Encounter
Admission: RE | Disposition: A | Discharge status: Home / Self Care | Payer: Self-pay | Source: Home / Self Care | Attending: Gastroenterology

## 2023-12-18 ENCOUNTER — Other Ambulatory Visit: Payer: Self-pay

## 2023-12-18 ENCOUNTER — Ambulatory Visit: Admitting: Anesthesiology

## 2023-12-18 ENCOUNTER — Ambulatory Visit
Admission: RE | Admit: 2023-12-18 | Discharge: 2023-12-18 | Disposition: A | Discharge status: Home / Self Care | Attending: Gastroenterology | Admitting: Gastroenterology

## 2023-12-18 ENCOUNTER — Encounter: Payer: Self-pay | Admitting: Gastroenterology

## 2023-12-18 DIAGNOSIS — K641 Second degree hemorrhoids: Secondary | ICD-10-CM | POA: Diagnosis not present

## 2023-12-18 DIAGNOSIS — D122 Benign neoplasm of ascending colon: Secondary | ICD-10-CM | POA: Insufficient documentation

## 2023-12-18 DIAGNOSIS — I10 Essential (primary) hypertension: Secondary | ICD-10-CM | POA: Diagnosis not present

## 2023-12-18 DIAGNOSIS — K635 Polyp of colon: Secondary | ICD-10-CM

## 2023-12-18 DIAGNOSIS — Z79899 Other long term (current) drug therapy: Secondary | ICD-10-CM | POA: Diagnosis not present

## 2023-12-18 DIAGNOSIS — K5730 Diverticulosis of large intestine without perforation or abscess without bleeding: Secondary | ICD-10-CM | POA: Diagnosis not present

## 2023-12-18 DIAGNOSIS — Z860100 Personal history of colon polyps, unspecified: Secondary | ICD-10-CM

## 2023-12-18 DIAGNOSIS — Z860101 Personal history of adenomatous and serrated colon polyps: Secondary | ICD-10-CM

## 2023-12-18 DIAGNOSIS — Z1211 Encounter for screening for malignant neoplasm of colon: Secondary | ICD-10-CM | POA: Diagnosis present

## 2023-12-18 HISTORY — PX: COLONOSCOPY: SHX5424

## 2023-12-18 HISTORY — PX: POLYPECTOMY: SHX149

## 2023-12-18 SURGERY — COLONOSCOPY
Anesthesia: General

## 2023-12-18 MED ORDER — PROPOFOL 500 MG/50ML IV EMUL
INTRAVENOUS | Status: DC | PRN
  Administered 2023-12-18: 75 ug/kg/min via INTRAVENOUS

## 2023-12-18 MED ORDER — LIDOCAINE HCL (CARDIAC) PF 100 MG/5ML IV SOSY
PREFILLED_SYRINGE | INTRAVENOUS | Status: DC | PRN
  Administered 2023-12-18: 60 mg via INTRAVENOUS

## 2023-12-18 MED ORDER — DEXMEDETOMIDINE HCL IN NACL 80 MCG/20ML IV SOLN
INTRAVENOUS | Status: DC | PRN
  Administered 2023-12-18: 12 ug via INTRAVENOUS
  Administered 2023-12-18: 8 ug via INTRAVENOUS

## 2023-12-18 MED ORDER — PROPOFOL 10 MG/ML IV BOLUS
INTRAVENOUS | Status: DC | PRN
  Administered 2023-12-18 (×2): 50 mg via INTRAVENOUS

## 2023-12-18 MED ORDER — SODIUM CHLORIDE 0.9 % IV SOLN: INTRAVENOUS | Status: DC

## 2023-12-18 MED ORDER — LIDOCAINE HCL (PF) 2 % IJ SOLN
INTRAMUSCULAR | Status: AC
  Filled 2023-12-18: qty 5

## 2023-12-18 NOTE — Anesthesia Postprocedure Evaluation (Signed)
 Anesthesia Post Note  Patient: Charlotte Jennings  Procedure(s) Performed: COLONOSCOPY POLYPECTOMY, INTESTINE  Patient location during evaluation: Endoscopy Anesthesia Type: General Level of consciousness: awake and alert Pain management: pain level controlled Vital Signs Assessment: post-procedure vital signs reviewed and stable Respiratory status: spontaneous breathing, nonlabored ventilation and respiratory function stable Cardiovascular status: blood pressure returned to baseline and stable Postop Assessment: no apparent nausea or vomiting Anesthetic complications: no   No notable events documented.   Last Vitals:  Vitals:   12/18/23 1100 12/18/23 1106  BP: 98/76 108/73  Pulse: 79 74  Resp: 14 16  Temp:    SpO2: 100% 98%    Last Pain:  Vitals:   12/18/23 1100  TempSrc:   PainSc: 0-No pain                 Fairy MARLA Aquanetta Schwarz

## 2023-12-18 NOTE — Transfer of Care (Signed)
 Immediate Anesthesia Transfer of Care Note  Patient: Charlotte Jennings  Procedure(s) Performed: COLONOSCOPY POLYPECTOMY, INTESTINE  Patient Location: PACU  Anesthesia Type:General  Level of Consciousness: sedated  Airway & Oxygen Therapy: Patient Spontanous Breathing  Post-op Assessment: Report given to RN and Post -op Vital signs reviewed and stable  Post vital signs: Reviewed and stable  Last Vitals:  Vitals Value Taken Time  BP 73/60 12/18/23 10:42  Temp    Pulse 81 12/18/23 10:44  Resp 20 12/18/23 10:44  SpO2 98 % 12/18/23 10:44  Vitals shown include unfiled device data.  Last Pain:  Vitals:   12/18/23 1005  TempSrc: Temporal  PainSc: 0-No pain         Complications: No notable events documented.

## 2023-12-18 NOTE — Op Note (Signed)
 [facility] Gastroenterology Patient Name: Charlotte Jennings Procedure Date: 12/18/2023 10:19 AM MRN: [id number] Account #: [id number] Date of Birth: June 26, 1950 Admit Type: Outpatient Age: 73 Room: [facility] ENDO ROOM 3 Gender: Female Note Status: Finalized Instrument Name: Colon Scope [id number] Procedure:             Colonoscopy Indications:           High risk colon cancer surveillance: Personal history                         of colonic polyps Providers:             Rogelia Copping MD, MD Referring MD:          Rogelia Copping MD, MD (Referring MD) Medicines:             Propofol  per Anesthesia Complications:         No immediate complications. Procedure:             Pre-Anesthesia Assessment:                        - Prior to the procedure, a History and Physical was                         performed, and patient medications and allergies were                         reviewed. The patient's tolerance of previous                         anesthesia was also reviewed. The risks and benefits                         of the procedure and the sedation options and risks                         were discussed with the patient. All questions were                         answered, and informed consent was obtained. Prior                         Anticoagulants: The patient has taken no anticoagulant                         or antiplatelet agents. ASA Grade Assessment: II - A                         patient with mild systemic disease. After reviewing                         the risks and benefits, the patient was deemed in                         satisfactory condition to undergo the procedure.                        After obtaining informed consent, the colonoscope was  passed under direct vision. Throughout the procedure,                         the patient's blood pressure, pulse, and oxygen                         saturations were monitored continuously. The was                          introduced through the anus and advanced to the the                         cecum, identified by appendiceal orifice and ileocecal                         valve. The colonoscopy was performed without                         difficulty. The patient tolerated the procedure well.                         The quality of the bowel preparation was excellent. Findings:      The perianal and digital rectal examinations were normal.      A 2 mm polyp was found in the ascending colon. The polyp was sessile.       The polyp was removed with a cold biopsy forceps. Resection and       retrieval were complete.      Many small-mouthed diverticula were found in the entire colon.      Non-bleeding internal hemorrhoids were found during retroflexion. The       hemorrhoids were Grade II (internal hemorrhoids that prolapse but reduce       spontaneously). Impression:            - One 2 mm polyp in the ascending colon, removed with                         a cold biopsy forceps. Resected and retrieved.                        - Diverticulosis in the entire examined colon.                        - Non-bleeding internal hemorrhoids. Recommendation:        - Discharge patient to home.                        - Resume previous diet.                        - Continue present medications.                        - Await pathology results.                        - Repeat colonoscopy in 7 years for surveillance. Procedure Code(s):     --- Professional ---  54619, Colonoscopy, flexible; with biopsy, single or                         multiple Diagnosis Code(s):     --- Professional ---                        Z86.010, Personal history of colonic polyps                        D12.2, Benign neoplasm of ascending colon CPT copyright 2022 American [employer] Association. All rights reserved. The codes documented in this report are preliminary and upon coder review may  be revised to  meet current compliance requirements. Rogelia Copping MD, MD 12/18/2023 10:38:29 AM This report has been signed electronically. Number of Addenda: 0 Note Initiated On: 12/18/2023 10:19 AM Scope Withdrawal Time: 0 hours 6 minutes 42 seconds  Total Procedure Duration: 0 hours 12 minutes 29 seconds  Estimated Blood Loss:  Estimated blood loss: none.      [facility]

## 2023-12-18 NOTE — H&P (Signed)
   Rogelia Copping, MD [facility] [address], [address] [place], [place] [address] Phone:[phone] Fax : [phone]  Primary Care Physician:  Patient, No Pcp Per Primary Gastroenterologist:  Dr. Copping  Pre-Procedure History & Physical: HPI:  Charlotte Jennings is a 73 y.o. female is here for an colonoscopy.   Past Medical History:  Diagnosis Date   Hypertension     Past Surgical History:  Procedure Laterality Date   COLONOSCOPY WITH PROPOFOL  N/A 02/12/2018   Procedure: COLONOSCOPY WITH PROPOFOL ;  Surgeon: Therisa Bi, MD;  Location: [facility] ENDOSCOPY;  Service: Gastroenterology;  Laterality: N/A;   COLONOSCOPY WITH PROPOFOL  N/A 03/07/2023   Procedure: COLONOSCOPY WITH PROPOFOL ;  Surgeon: Therisa Bi, MD;  Location: [facility] ENDOSCOPY;  Service: Gastroenterology;  Laterality: N/A;    Prior to Admission medications   Medication Sig Start Date End Date Taking? Authorizing Provider  Cholecalciferol (VITAMIN D-3 PO) Take by mouth.   Yes [provider]  lisinopril-hydrochlorothiazide (PRINZIDE,ZESTORETIC) 20-25 MG tablet Take 1 tablet by mouth daily.   Yes [provider]    Allergies as of 09/25/2023   (No Known Allergies)    History reviewed. No pertinent family history.  Social History   Socioeconomic History   Marital status: Divorced    Spouse name: Not on file   Number of children: Not on file   Years of education: Not on file   Highest education level: Not on file  Occupational History   Not on file  Tobacco Use   Smoking status: Never   Smokeless tobacco: Never  Vaping Use   Vaping status: Never Used  Substance and Sexual Activity   Alcohol use: Not Currently   Drug use: Never   Sexual activity: Not on file  Other Topics Concern   Not on file  Social History Narrative   Not on file   Social Drivers of Health   Financial Resource Strain: Not on file  Food Insecurity: Not on file  Transportation Needs: Not on file  Physical Activity: Not on file   Stress: Not on file  Social Connections: Not on file  Intimate Partner Violence: Not on file    Review of Systems: See HPI, otherwise negative ROS  Physical Exam: BP (!) 139/91   Pulse 68   Temp (!) 96.3 F (35.7 C) (Temporal)   Resp 13   Ht 5' 5 (1.651 m)   Wt 66 kg   SpO2 100%   BMI 24.20 kg/m  General:   Alert,  pleasant and cooperative in NAD Head:  Normocephalic and atraumatic. Neck:  Supple; no masses or thyromegaly. Lungs:  Clear throughout to auscultation.    Heart:  Regular rate and rhythm. Abdomen:  Soft, nontender and nondistended. Normal bowel sounds, without guarding, and without rebound.   Neurologic:  Alert and  oriented x4;  grossly normal neurologically.  Impression/Plan: Charlotte Jennings is here for an colonoscopy to be performed for a history of adenomatous polyps on 2019   Risks, benefits, limitations, and alternatives regarding  colonoscopy have been reviewed with the patient.  Questions have been answered.  All parties agreeable.   Rogelia Copping, MD  12/18/2023, 10:15 AM

## 2023-12-18 NOTE — Anesthesia Preprocedure Evaluation (Signed)
 Anesthesia Evaluation  Patient identified by MRN, date of birth, ID band Patient awake    Reviewed: Allergy & Precautions, NPO status , Patient's Chart, lab work & pertinent test results  History of Anesthesia Complications Negative for: history of anesthetic complications  Airway Mallampati: III  TM Distance: <3 FB Neck ROM: full    Dental  (+) Chipped, Poor Dentition, Missing   Pulmonary neg shortness of breath   Pulmonary exam normal        Cardiovascular Exercise Tolerance: Good hypertension, (-) angina Normal cardiovascular exam     Neuro/Psych negative neurological ROS  negative psych ROS   GI/Hepatic negative GI ROS, Neg liver ROS,neg GERD  ,,  Endo/Other  negative endocrine ROS    Renal/GU negative Renal ROS  negative genitourinary   Musculoskeletal   Abdominal   Peds  Hematology negative hematology ROS (+)   Anesthesia Other Findings Past Medical History: No date: Hypertension  Past Surgical History: 02/12/2018: COLONOSCOPY WITH PROPOFOL ; N/A     Comment:  Procedure: COLONOSCOPY WITH PROPOFOL ;  Surgeon: Therisa Bi, MD;  Location: [facility] ENDOSCOPY;  Service:               Gastroenterology;  Laterality: N/A; 03/07/2023: COLONOSCOPY WITH PROPOFOL ; N/A     Comment:  Procedure: COLONOSCOPY WITH PROPOFOL ;  Surgeon: Therisa Bi, MD;  Location: [facility] ENDOSCOPY;  Service:               Gastroenterology;  Laterality: N/A;  BMI    Body Mass Index: 24.20 kg/m      Reproductive/Obstetrics negative OB ROS                              Anesthesia Physical Anesthesia Plan  ASA: 2  Anesthesia Plan: General   Post-op Pain Management:    Induction: Intravenous  PONV Risk Score and Plan: Propofol  infusion and TIVA  Airway Management Planned: Natural Airway and Nasal Cannula  Additional Equipment:   Intra-op Plan:   Post-operative Plan:    Informed Consent: I have reviewed the patients History and Physical, chart, labs and discussed the procedure including the risks, benefits and alternatives for the proposed anesthesia with the patient or authorized representative who has indicated his/her understanding and acceptance.     Dental Advisory Given  Plan Discussed with: Anesthesiologist, CRNA and Surgeon  Anesthesia Plan Comments: (Patient consented for risks of anesthesia including but not limited to:  - adverse reactions to medications - risk of airway placement if required - damage to eyes, teeth, lips or other oral mucosa - nerve damage due to positioning  - sore throat or hoarseness - Damage to heart, brain, nerves, lungs, other parts of body or loss of life  Patient voiced understanding and assent.)        Anesthesia Quick Evaluation

## 2023-12-22 ENCOUNTER — Ambulatory Visit: Payer: Self-pay | Admitting: Gastroenterology

## 2023-12-22 LAB — SURGICAL PATHOLOGY
